# Patient Record
Sex: Female | Born: 1980 | Race: White | Hispanic: No | Marital: Married
Health system: Southern US, Community
[De-identification: ages and names within clinical notes are randomized; demographics above are authoritative.]

---

## 1999-12-21 ENCOUNTER — Encounter: Payer: Self-pay | Admitting: Family Medicine

## 1999-12-21 ENCOUNTER — Encounter: Admission: RE | Admit: 1999-12-21 | Discharge: 1999-12-21 | Payer: Self-pay | Admitting: Family Medicine

## 2000-03-23 ENCOUNTER — Other Ambulatory Visit: Admission: RE | Admit: 2000-03-23 | Discharge: 2000-03-23 | Payer: Self-pay | Admitting: Gynecology

## 2001-12-14 ENCOUNTER — Other Ambulatory Visit: Admission: RE | Admit: 2001-12-14 | Discharge: 2001-12-14 | Payer: Self-pay | Admitting: Gynecology

## 2002-04-22 ENCOUNTER — Encounter: Admission: RE | Admit: 2002-04-22 | Discharge: 2002-04-22 | Payer: Self-pay | Admitting: Gynecology

## 2002-04-22 ENCOUNTER — Encounter: Payer: Self-pay | Admitting: Gynecology

## 2002-12-23 ENCOUNTER — Other Ambulatory Visit: Admission: RE | Admit: 2002-12-23 | Discharge: 2002-12-23 | Payer: Self-pay | Admitting: Obstetrics and Gynecology

## 2003-12-29 ENCOUNTER — Other Ambulatory Visit: Admission: RE | Admit: 2003-12-29 | Discharge: 2003-12-29 | Payer: Self-pay | Admitting: Obstetrics and Gynecology

## 2006-03-08 ENCOUNTER — Ambulatory Visit: Payer: Self-pay | Admitting: Oncology

## 2006-04-21 LAB — PROTIME-INR: Protime: 12.3 Seconds (ref 10.6–13.4)

## 2006-04-28 LAB — VON WILLEBRAND PANEL
Factor-VIII Activity: 36 % — ABNORMAL LOW (ref 75–150)
Ristocetin-Cofactor: <20 % — ABNORMAL LOW (ref 50–150)

## 2008-02-28 ENCOUNTER — Emergency Department (HOSPITAL_COMMUNITY): Admission: EM | Admit: 2008-02-28 | Discharge: 2008-02-28 | Payer: Self-pay | Admitting: Emergency Medicine

## 2010-01-29 ENCOUNTER — Encounter: Admission: RE | Admit: 2010-01-29 | Discharge: 2010-01-29 | Payer: Self-pay | Admitting: Obstetrics and Gynecology

## 2010-05-21 ENCOUNTER — Ambulatory Visit (HOSPITAL_COMMUNITY): Admission: RE | Admit: 2010-05-21 | Discharge: 2010-05-21 | Payer: Self-pay | Admitting: Obstetrics and Gynecology

## 2010-10-07 ENCOUNTER — Ambulatory Visit: Payer: Self-pay | Admitting: Oncology

## 2011-02-21 ENCOUNTER — Encounter (HOSPITAL_COMMUNITY)
Admission: RE | Admit: 2011-02-21 | Discharge: 2011-02-21 | Disposition: A | Discharge status: Home / Self Care | Payer: Commercial Indemnity | Source: Ambulatory Visit | Attending: Oncology | Admitting: Oncology

## 2011-02-21 ENCOUNTER — Encounter (HOSPITAL_BASED_OUTPATIENT_CLINIC_OR_DEPARTMENT_OTHER): Payer: Commercial Indemnity | Admitting: Oncology

## 2011-02-21 ENCOUNTER — Other Ambulatory Visit: Payer: Self-pay | Admitting: Oncology

## 2011-02-21 DIAGNOSIS — D680 Von Willebrand's disease: Secondary | ICD-10-CM

## 2011-02-21 DIAGNOSIS — D649 Anemia, unspecified: Secondary | ICD-10-CM | POA: Insufficient documentation

## 2011-02-21 LAB — CBC WITH DIFFERENTIAL/PLATELET
Basophils Absolute: 0 10*3/uL (ref 0.0–0.1)
Eosinophils Absolute: 0.1 10*3/uL (ref 0.0–0.5)
HGB: 12.7 g/dL (ref 11.6–15.9)
MCV: 93.7 fL (ref 79.5–101.0)
MONO#: 0.7 10*3/uL (ref 0.1–0.9)
NEUT#: 11.8 10*3/uL — ABNORMAL HIGH (ref 1.5–6.5)
Platelets: 182 10*3/uL (ref 145–400)
RBC: 3.98 10*6/uL (ref 3.70–5.45)
RDW: 13.4 % (ref 11.2–14.5)
WBC: 14.1 10*3/uL — ABNORMAL HIGH (ref 3.9–10.3)

## 2011-02-21 LAB — ABO/RH: ABO/RH(D): O POS

## 2011-02-21 LAB — APTT: aPTT: 31 seconds (ref 24–37)

## 2011-02-25 ENCOUNTER — Encounter (HOSPITAL_COMMUNITY): Payer: Commercial Indemnity

## 2011-02-25 LAB — ABO/RH

## 2011-02-28 LAB — ABO/RH: ABO/RH(D): O POS

## 2011-03-01 LAB — VON WILLEBRAND PANEL: Factor-VIII Activity: 133 % (ref 50–150)

## 2011-03-18 ENCOUNTER — Inpatient Hospital Stay (HOSPITAL_COMMUNITY)
Admission: AD | Admit: 2011-03-18 | Discharge: 2011-03-20 | DRG: 774 | Disposition: A | Discharge status: Home / Self Care | Payer: Managed Care, Other (non HMO) | Source: Ambulatory Visit | Attending: Obstetrics and Gynecology | Admitting: Obstetrics and Gynecology

## 2011-03-18 DIAGNOSIS — O4590 Premature separation of placenta, unspecified, unspecified trimester: Secondary | ICD-10-CM | POA: Diagnosis present

## 2011-03-18 LAB — CBC
HCT: 39.7 % (ref 36.0–46.0)
Hemoglobin: 13.4 g/dL (ref 12.0–15.0)
MCH: 30.9 pg (ref 26.0–34.0)
MCHC: 33.8 g/dL (ref 30.0–36.0)
MCV: 91.7 fL (ref 78.0–100.0)
Platelets: 174 10*3/uL (ref 150–400)
RBC: 4.33 MIL/uL (ref 3.87–5.11)
RDW: 13.4 % (ref 11.5–15.5)
WBC: 19 10*3/uL — ABNORMAL HIGH (ref 4.0–10.5)

## 2011-03-19 LAB — CBC
Platelets: 169 10*3/uL (ref 150–400)
RBC: 3.58 MIL/uL — ABNORMAL LOW (ref 3.87–5.11)
RDW: 13.5 % (ref 11.5–15.5)
WBC: 20.7 10*3/uL — ABNORMAL HIGH (ref 4.0–10.5)

## 2011-03-19 LAB — RPR: RPR Ser Ql: NONREACTIVE

## 2011-03-27 ENCOUNTER — Inpatient Hospital Stay (HOSPITAL_COMMUNITY): Admission: AD | Admit: 2011-03-27 | Payer: Self-pay | Admitting: Obstetrics and Gynecology

## 2011-03-31 IMAGING — MG MM DIAGNOSTIC BILATERAL
7 series · 7 of 7 positions shown · non-contrast
Comparison: None, baseline

CLINICAL DATA: Patient has noted a bloody nipple discharge on the
left, when expressed. The patient denies spontaneous discharge.

DIGITAL DIAGNOSTIC BILATERAL MAMMOGRAM CAD AND BILATERAL BREAST
ULTRASOUND:

[R CC]
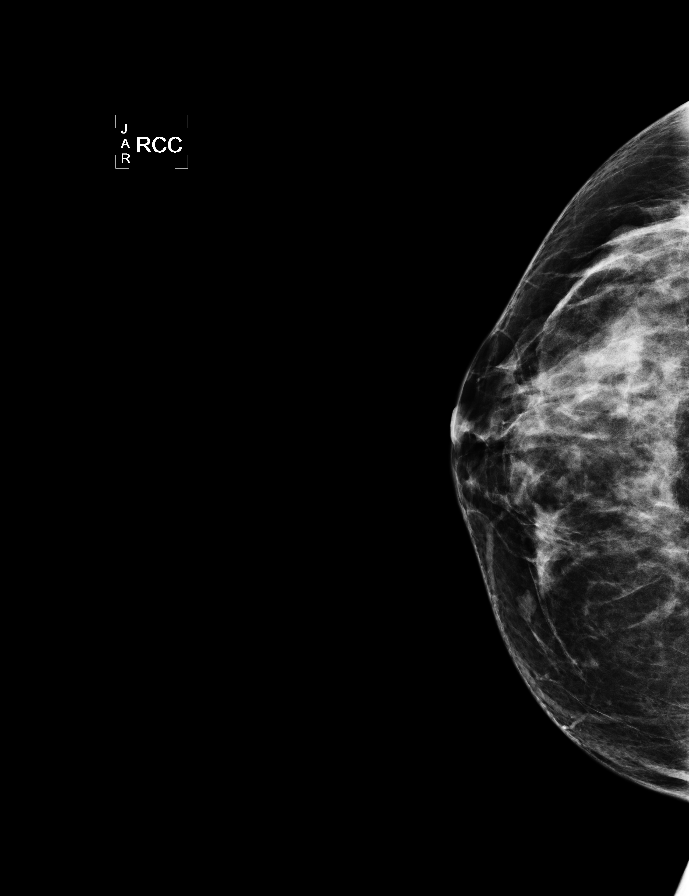

[L CC (1 of 3)]
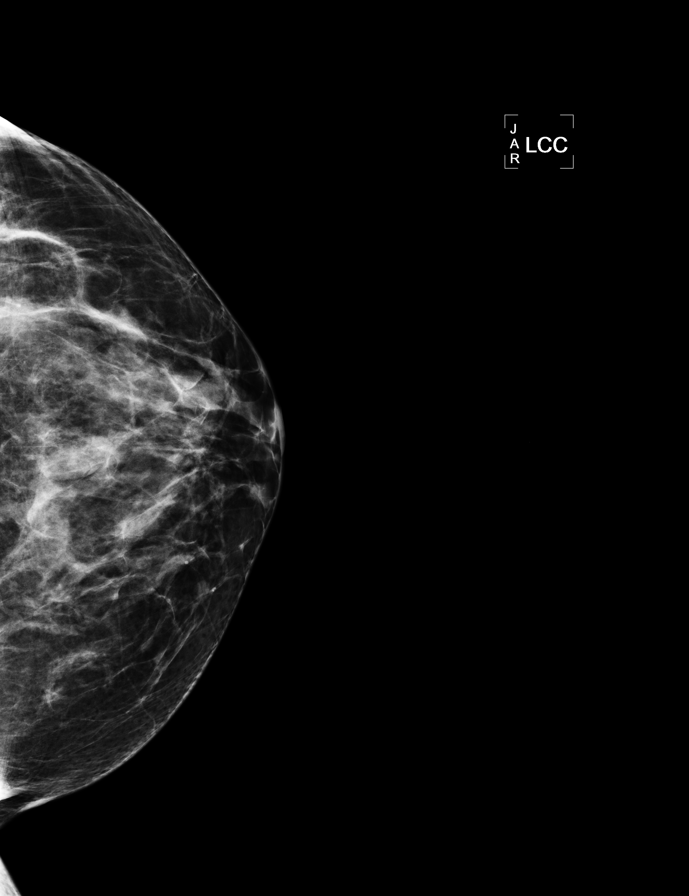

[L MLO (1 of 2)]
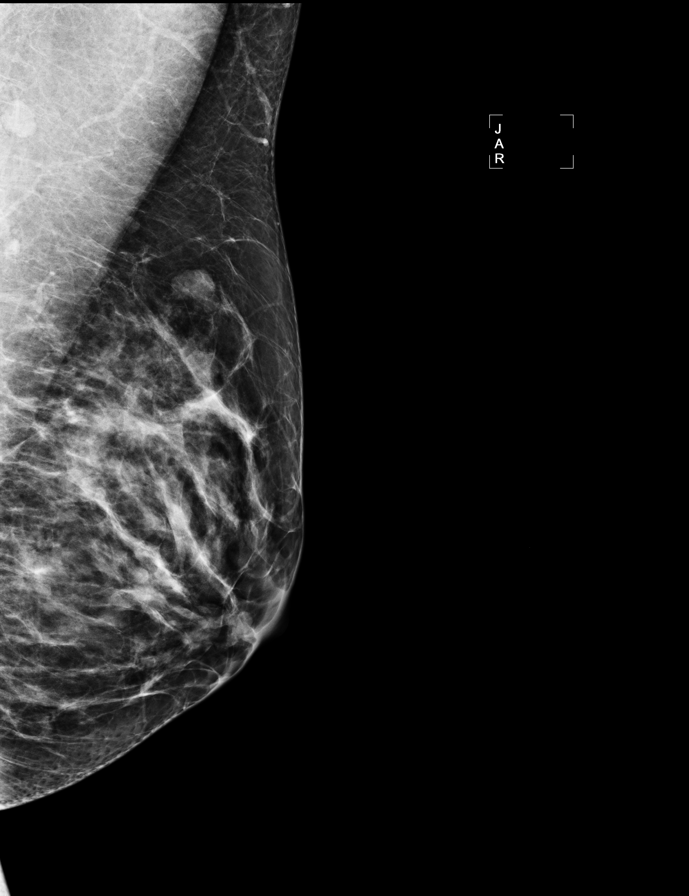

[R MLO]
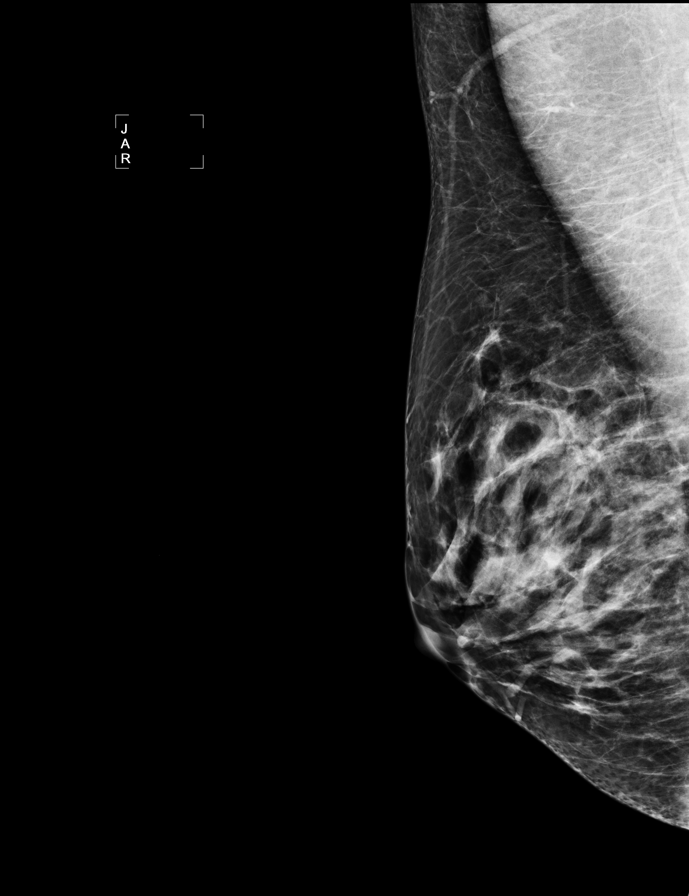

[L CC (2 of 3)]
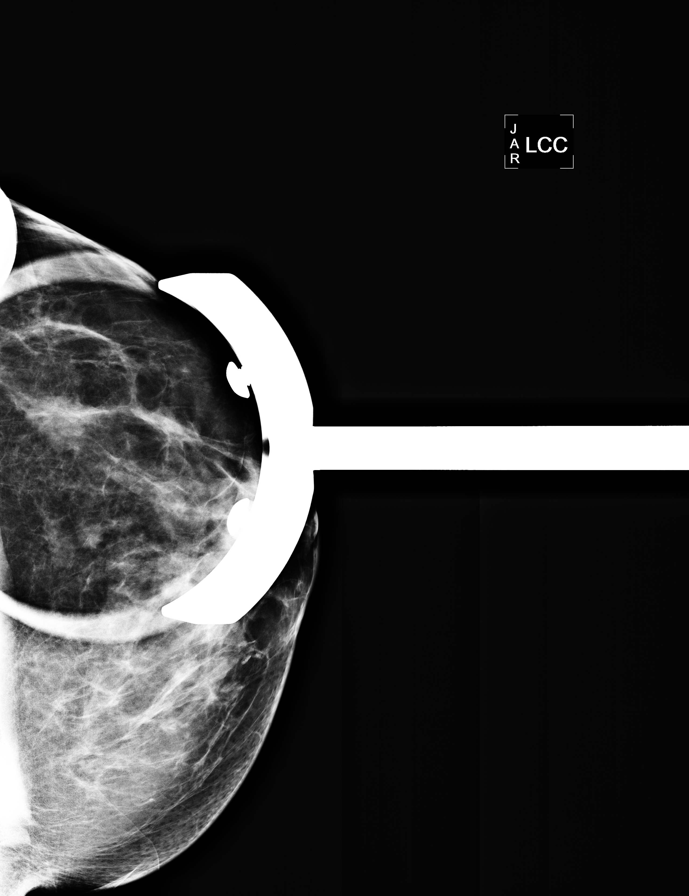

[L CC (3 of 3)]
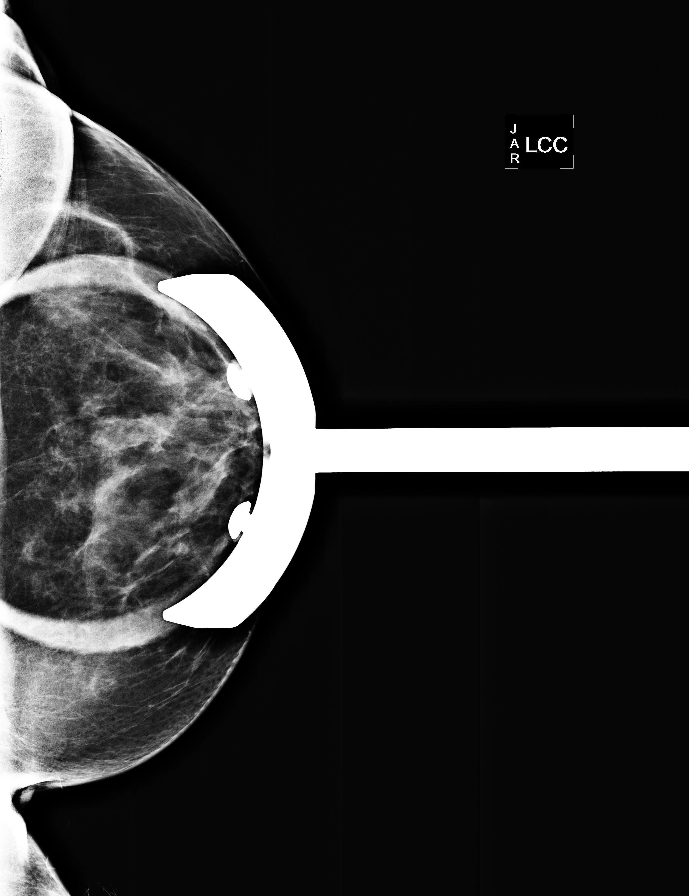

[L MLO (2 of 2)]
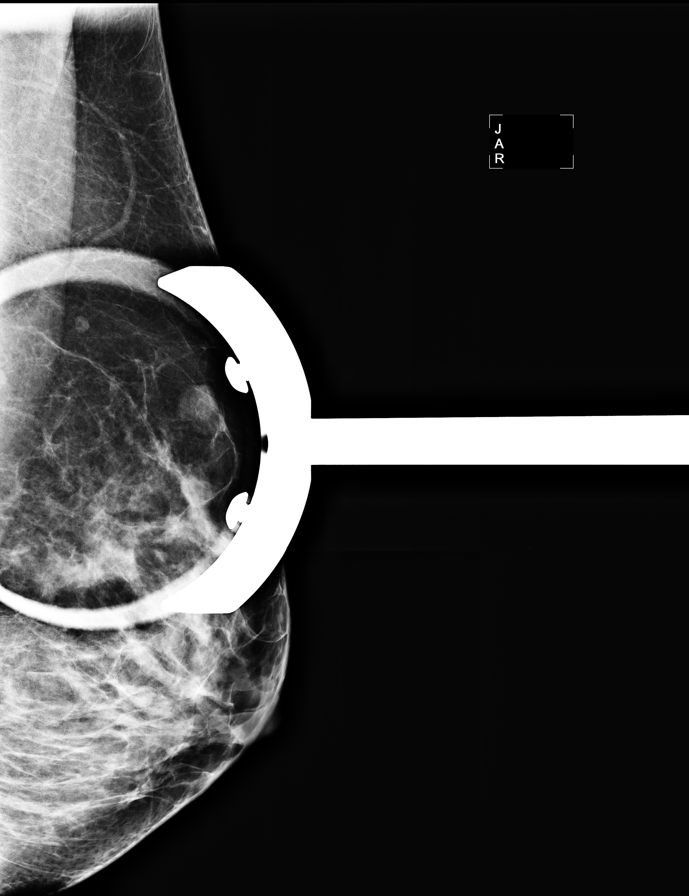

[7 of 7 positions shown; findings below may reference images not displayed]

FINDINGS: Breast parenchyma is extremely dense.  Nodule is
identified in the upper central portion of the left breast.
Possible nodules also identified in the medial aspect of the right
breast.
Mammographic images were processed with CAD.

On physical exam, I do not palpate an abnormality in the medial
aspect of the right breast.

I palpate no abnormality in the upper central portion or lateral
quadrants of the left breast.

Ultrasound is performed, showing Ultrasound is performed in medial
quadrants, showing two adjacent simple cysts in the 2 o'clock
position of the right breast 4 cm from the nipple.  Ectatic ducts
are also identified in the 6 o'clock location of the right breast.
No solid mass or area of acoustic shadowing identified in the
medial quadrants.

On the left, a simple cyst is identified in the 12 o'clock
location, 5 cm from the nipple.  This measures 6 x 7 x 6 mm.
Evaluation of the lateral quadrants on the left is otherwise
unremarkable.
IMPRESSION: 1.  No evidence for malignancy in either breast.  Bilateral simple
cysts are identified, the largest of which is on the left. Annual
screening mammography is recommended to begin at age 40.
2.  The type of discharge the patient has is consistent with a
benign process.  Ductogram is not indicated at this time.

BI-RADS CATEGORY 2:  Benign finding(s).

## 2019-04-24 ENCOUNTER — Other Ambulatory Visit: Payer: Self-pay | Admitting: Obstetrics and Gynecology

## 2019-04-24 DIAGNOSIS — R928 Other abnormal and inconclusive findings on diagnostic imaging of breast: Secondary | ICD-10-CM

## 2019-05-02 ENCOUNTER — Ambulatory Visit
Admission: RE | Admit: 2019-05-02 | Discharge: 2019-05-02 | Disposition: A | Discharge status: Home / Self Care | Payer: Managed Care, Other (non HMO) | Source: Ambulatory Visit | Attending: Obstetrics and Gynecology | Admitting: Obstetrics and Gynecology

## 2019-05-02 ENCOUNTER — Ambulatory Visit
Admission: RE | Admit: 2019-05-02 | Discharge: 2019-05-02 | Disposition: A | Discharge status: Home / Self Care | Payer: 59 | Source: Ambulatory Visit | Attending: Obstetrics and Gynecology | Admitting: Obstetrics and Gynecology

## 2019-05-02 ENCOUNTER — Other Ambulatory Visit: Payer: Self-pay

## 2019-05-02 DIAGNOSIS — R928 Other abnormal and inconclusive findings on diagnostic imaging of breast: Secondary | ICD-10-CM

## 2020-04-30 ENCOUNTER — Other Ambulatory Visit: Payer: Self-pay | Admitting: Obstetrics and Gynecology

## 2020-04-30 DIAGNOSIS — R928 Other abnormal and inconclusive findings on diagnostic imaging of breast: Secondary | ICD-10-CM

## 2020-05-18 ENCOUNTER — Ambulatory Visit
Admission: RE | Admit: 2020-05-18 | Discharge: 2020-05-18 | Disposition: A | Discharge status: Home / Self Care | Payer: 59 | Source: Ambulatory Visit | Attending: Obstetrics and Gynecology | Admitting: Obstetrics and Gynecology

## 2020-05-18 ENCOUNTER — Other Ambulatory Visit: Payer: Self-pay

## 2020-05-18 DIAGNOSIS — R928 Other abnormal and inconclusive findings on diagnostic imaging of breast: Secondary | ICD-10-CM

## 2020-07-01 IMAGING — US ULTRASOUND RIGHT BREAST LIMITED
1 series · 4 of 4 positions shown · non-contrast
Comparison: Previous exam(s).

CLINICAL DATA: Screening recall for a possible right breast mass.

EXAM:
DIGITAL DIAGNOSTIC RIGHT MAMMOGRAM WITH CAD AND TOMO
ULTRASOUND RIGHT BREAST

[Series 1: ultrasound right breast limited · 0.07mm/px · 4 of 4 slices shown]
[im 1/4]
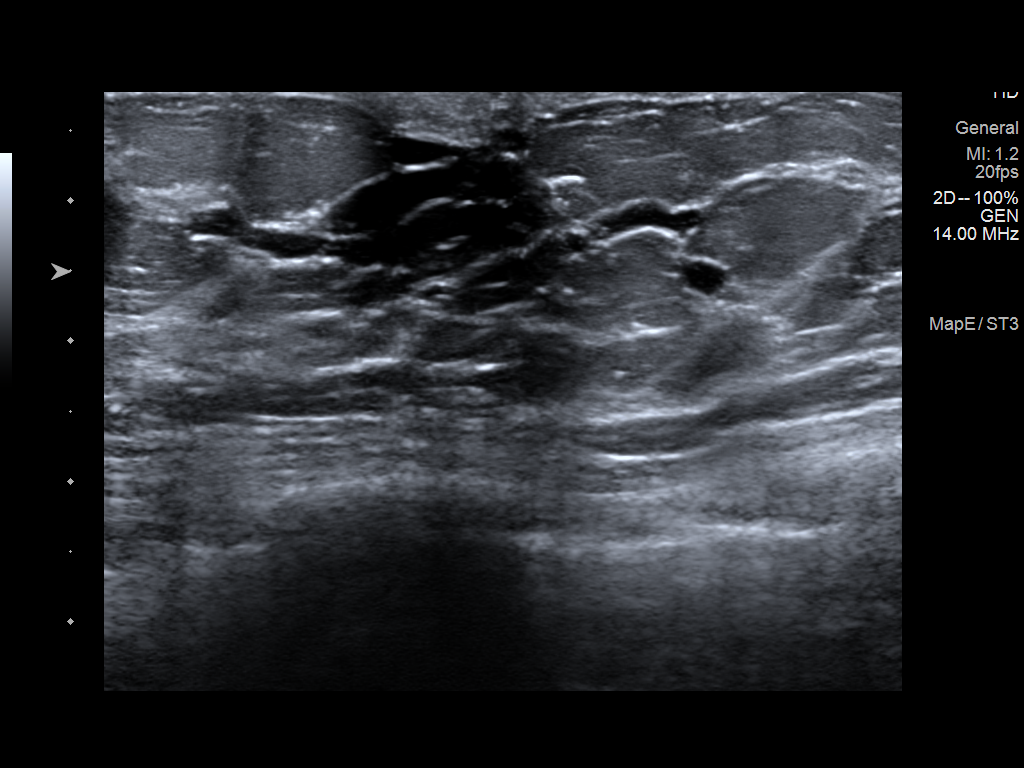
[im 2/4]
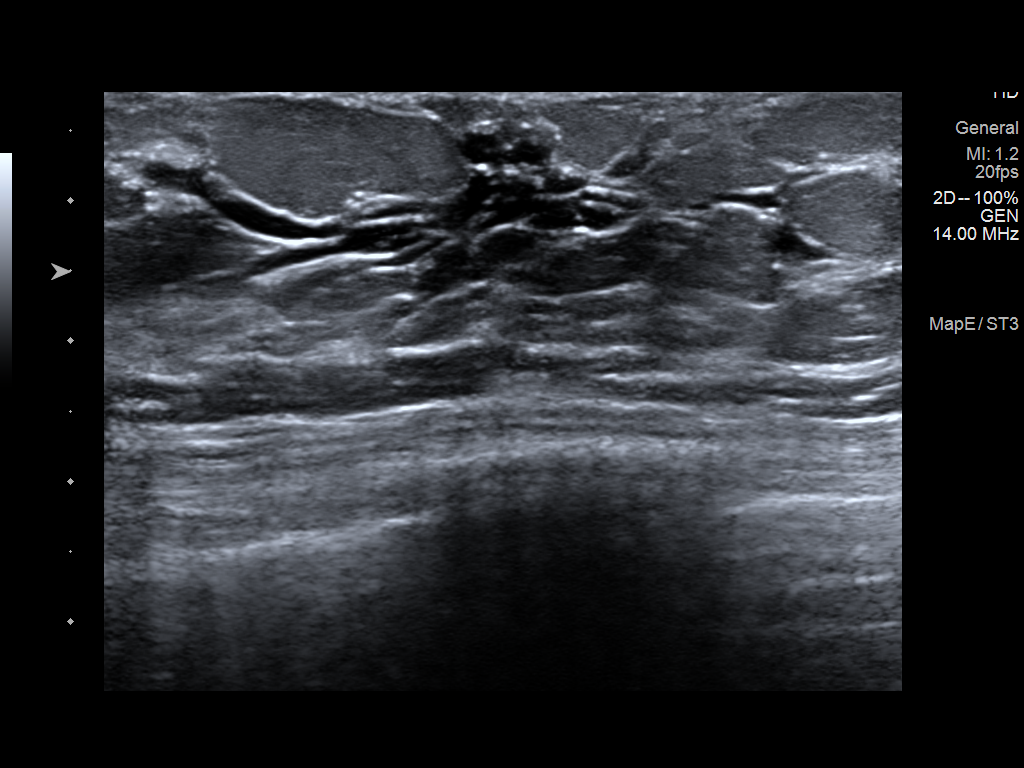
[im 3/4]
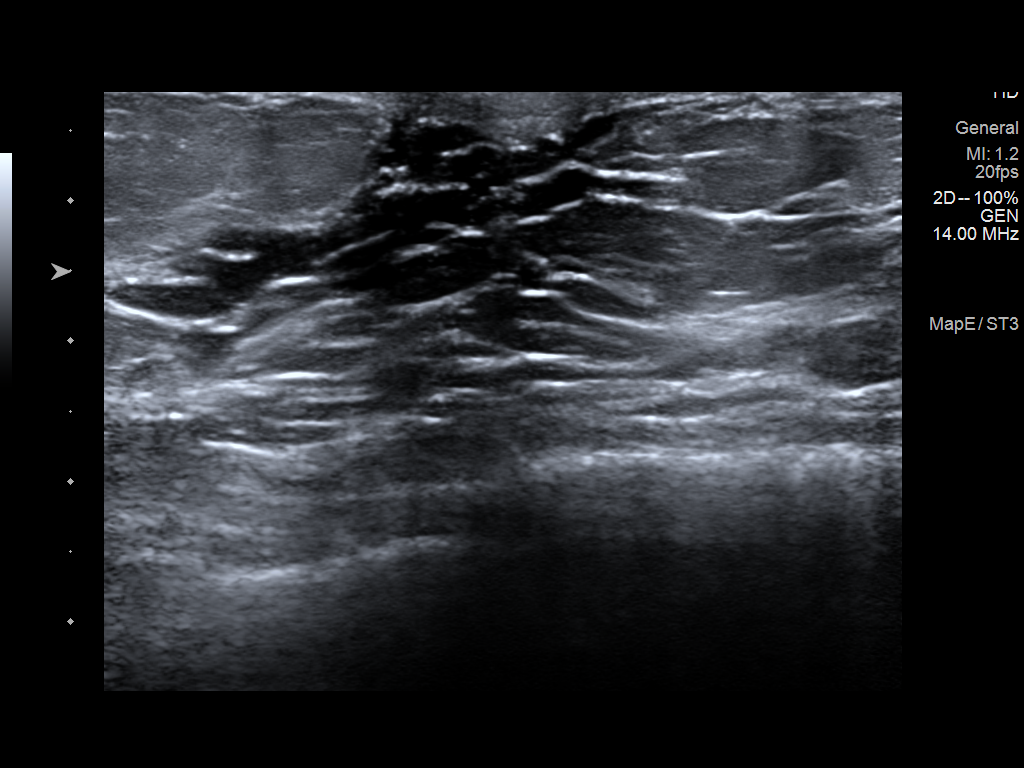
[im 4/4]
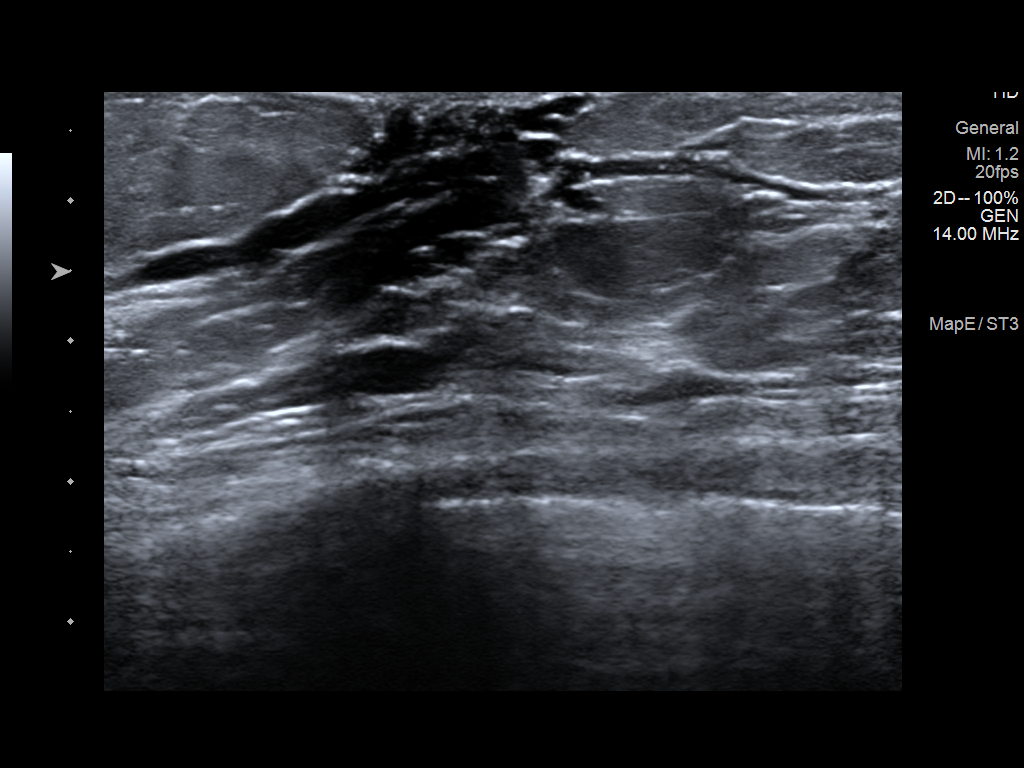

[4 of 4 positions shown; findings below may reference images not displayed]

ACR Breast Density Category c: The breast tissue is heterogeneously
dense, which may obscure small masses.
FINDINGS: Spot compression tomosynthesis images of the retroareolar right
breast demonstrates circumscribed lobulated tubular serpiginous
structure, which may represent prominent ducts. Ultrasound will be
performed for further evaluation.

Mammographic images were processed with CAD.

Ultrasound of the retroareolar right breast demonstrates numerous
tubular anechoic structures consistent with ducts.
IMPRESSION: Benign prominent ducts are identified in the retroareolar right
breast.

RECOMMENDATION:
Screening mammogram in one year.(Code:ET-F-I92)

I have discussed the findings and recommendations with the patient.
Results were also provided in writing at the conclusion of the
visit. If applicable, a reminder letter will be sent to the patient
regarding the next appointment.

BI-RADS CATEGORY  2: Benign.

## 2020-07-31 ENCOUNTER — Other Ambulatory Visit: Payer: Self-pay | Admitting: Internal Medicine

## 2020-07-31 DIAGNOSIS — E063 Autoimmune thyroiditis: Secondary | ICD-10-CM

## 2020-07-31 DIAGNOSIS — E042 Nontoxic multinodular goiter: Secondary | ICD-10-CM

## 2020-08-10 ENCOUNTER — Ambulatory Visit
Admission: RE | Admit: 2020-08-10 | Discharge: 2020-08-10 | Disposition: A | Discharge status: Home / Self Care | Payer: 59 | Source: Ambulatory Visit | Attending: Internal Medicine | Admitting: Internal Medicine

## 2020-08-10 DIAGNOSIS — E063 Autoimmune thyroiditis: Secondary | ICD-10-CM

## 2020-08-10 DIAGNOSIS — E042 Nontoxic multinodular goiter: Secondary | ICD-10-CM

## 2022-04-21 ENCOUNTER — Ambulatory Visit: Payer: 59 | Admitting: Podiatry

## 2022-05-10 ENCOUNTER — Ambulatory Visit (INDEPENDENT_AMBULATORY_CARE_PROVIDER_SITE_OTHER): Payer: 59 | Admitting: Podiatry

## 2022-05-10 ENCOUNTER — Ambulatory Visit (INDEPENDENT_AMBULATORY_CARE_PROVIDER_SITE_OTHER): Payer: 59

## 2022-05-10 DIAGNOSIS — M722 Plantar fascial fibromatosis: Secondary | ICD-10-CM

## 2022-05-10 MED ORDER — MELOXICAM 15 MG PO TABS: 15.0000 mg | ORAL_TABLET | Freq: Every day | ORAL | 3 refills | Status: AC

## 2022-05-11 ENCOUNTER — Encounter: Payer: Self-pay | Admitting: Podiatry

## 2022-05-11 ENCOUNTER — Telehealth: Payer: Self-pay | Admitting: Podiatry

## 2022-05-11 NOTE — Progress Notes (Signed)
  Subjective:  Patient ID: Alexandra Madden, female    DOB: [birth year],  MRN: [id number]  Chief Complaint  Patient presents with   Foot Pain    BIL  heel pain - no known injury not diabetic    41 y.o. female presents with the above complaint. History confirmed with patient.  She has had bilateral heel pain for couple years off and on its worse after walking and hiking.  It radiates up the back of the heel causes a sharp pain.  The left seems to feel worse than the right  Objective:  Physical Exam: warm, good capillary refill, no trophic changes or ulcerative lesions, normal DP and PT pulses, and normal sensory exam. Left Foot: point tenderness over the heel pad and point tenderness of the mid plantar fascia Right Foot: point tenderness over the heel pad  No images are attached to the encounter.  Radiographs: Multiple views x-ray of both feet: no fracture, dislocation, swelling or degenerative changes noted and significant plantar spur on the left Assessment:   1. Plantar fasciitis, bilateral      Plan:  Patient was evaluated and treated and all questions answered.  Discussed the etiology and treatment options for plantar fasciitis including stretching, formal physical therapy, supportive shoegears such as a running shoe or sneaker, pre fabricated orthoses, injection therapy, and oral medications. We also discussed the role of surgical treatment of this for patients who do not improve after exhausting non-surgical treatment options.   -XR reviewed with patient -Educated patient on stretching and icing of the affected limb -Injection delivered to the plantar fascia of the left foot. -Rx for meloxicam. Educated on use, risks and benefits of the medication  After sterile prep with povidone-iodine solution and alcohol, the left heel was injected with 0.5cc 2% xylocaine plain, 0.5cc 0.5% marcaine plain, 5mg  triamcinolone acetonide, and 2mg  dexamethasone was injected along the medial  plantar fascia at the insertion on the plantar calcaneus. The patient tolerated the procedure well without complication.  No follow-ups on file.

## 2022-05-11 NOTE — Patient Instructions (Signed)

## 2022-05-11 NOTE — Telephone Encounter (Signed)
Patient needs PT exercises sent to her, they were not attached to her after  visit summary .  Email:  [email or web address]

## 2022-06-27 ENCOUNTER — Ambulatory Visit (INDEPENDENT_AMBULATORY_CARE_PROVIDER_SITE_OTHER): Payer: 59 | Admitting: Podiatry

## 2022-06-27 DIAGNOSIS — M722 Plantar fascial fibromatosis: Secondary | ICD-10-CM | POA: Diagnosis not present

## 2022-06-27 DIAGNOSIS — M21862 Other specified acquired deformities of left lower leg: Secondary | ICD-10-CM

## 2022-06-27 DIAGNOSIS — M62462 Contracture of muscle, left lower leg: Secondary | ICD-10-CM

## 2022-06-27 DIAGNOSIS — M62461 Contracture of muscle, right lower leg: Secondary | ICD-10-CM

## 2022-06-27 DIAGNOSIS — M21861 Other specified acquired deformities of right lower leg: Secondary | ICD-10-CM

## 2022-06-27 MED ORDER — METHYLPREDNISOLONE 4 MG PO TBPK: ORAL_TABLET | ORAL | 0 refills | Status: AC

## 2022-06-30 NOTE — Progress Notes (Signed)
  Subjective:  Patient ID: Alexandra Madden, female    DOB: 09/11/1981,  MRN: [id number]  Chief Complaint  Patient presents with   Plantar Fasciitis    Bilateral, 6 week follow up    41 y.o. female presents with the above complaint. History confirmed with patient.  She returns for follow-up the injection helped quite a bit but is now hurting again.  She took meloxicam for a few weeks but is taking it fairly inconsistently.  She is getting cramping in her arches which is new but the pain under the heel is not as bad.  She was wearing flip-flops most of the weekend  Objective:  Physical Exam: warm, good capillary refill, no trophic changes or ulcerative lesions, normal DP and PT pulses, and normal sensory exam. Left Foot: point tenderness over the heel pad and point tenderness of the mid plantar fascia Right Foot: point tenderness over the heel pad  No images are attached to the encounter.  Radiographs: Multiple views x-ray of both feet: no fracture, dislocation, swelling or degenerative changes noted and significant plantar spur on the left Assessment:   1. Plantar fasciitis, bilateral   2. Gastrocnemius equinus of left lower extremity   3. Gastrocnemius equinus of right lower extremity      Plan:  Patient was evaluated and treated and all questions answered.  Discussed the etiology and treatment options for plantar fasciitis including stretching, formal physical therapy, supportive shoegears such as a running shoe or sneaker, pre fabricated orthoses, injection therapy, and oral medications. We also discussed the role of surgical treatment of this for patients who do not improve after exhausting non-surgical treatment options.   -No repeat injection performed today. -Methylprednisolone taper sent to pharmacy -Continue Mobic as needed -Recommended formal physical therapy and referral was sent to [facility] -Expect this will eliminate and resolve at this point.  She will return as  needed, advised her if it does not improve within 6 to 8 weeks that a repeat injection may be indicated  Return if symptoms worsen or fail to improve.

## 2022-08-01 ENCOUNTER — Other Ambulatory Visit: Payer: Self-pay | Admitting: Home Modifications

## 2022-08-01 DIAGNOSIS — E010 Iodine-deficiency related diffuse (endemic) goiter: Secondary | ICD-10-CM

## 2022-08-17 ENCOUNTER — Telehealth: Payer: Self-pay | Admitting: *Deleted

## 2022-08-17 NOTE — Telephone Encounter (Signed)
Patient called for status of a referral to PT,had not heard anything.   Spoke with [facility] and they has not received anything. Refaxed referral to [facility],[address],per patient's request, confirmation received-08/17/22, patient aware.

## 2022-08-26 ENCOUNTER — Ambulatory Visit
Admission: RE | Admit: 2022-08-26 | Discharge: 2022-08-26 | Disposition: A | Discharge status: Home / Self Care | Payer: 59 | Source: Ambulatory Visit | Attending: Home Modifications | Admitting: Home Modifications

## 2022-08-26 DIAGNOSIS — E010 Iodine-deficiency related diffuse (endemic) goiter: Secondary | ICD-10-CM

## 2022-08-31 ENCOUNTER — Other Ambulatory Visit: Payer: Self-pay | Admitting: Home Modifications

## 2022-08-31 DIAGNOSIS — E042 Nontoxic multinodular goiter: Secondary | ICD-10-CM

## 2022-09-23 ENCOUNTER — Other Ambulatory Visit: Payer: Self-pay

## 2022-09-27 ENCOUNTER — Other Ambulatory Visit (HOSPITAL_COMMUNITY)
Admission: RE | Admit: 2022-09-27 | Discharge: 2022-09-27 | Disposition: A | Discharge status: Home / Self Care | Payer: 59 | Source: Ambulatory Visit | Attending: Interventional Radiology | Admitting: Interventional Radiology

## 2022-09-27 ENCOUNTER — Ambulatory Visit
Admission: RE | Admit: 2022-09-27 | Discharge: 2022-09-27 | Disposition: A | Discharge status: Home / Self Care | Payer: 59 | Source: Ambulatory Visit | Attending: Home Modifications | Admitting: Home Modifications

## 2022-09-27 DIAGNOSIS — E042 Nontoxic multinodular goiter: Secondary | ICD-10-CM

## 2022-09-29 LAB — CYTOLOGY - NON PAP
